# Patient Record
Sex: Female | Born: 1995 | Race: Black or African American | Hispanic: No | Marital: Single
Health system: Southern US, Community
[De-identification: ages and names within clinical notes are randomized; demographics above are authoritative.]

## PROBLEM LIST (undated history)

## (undated) DIAGNOSIS — R202 Paresthesia of skin: Secondary | ICD-10-CM

## (undated) DIAGNOSIS — R200 Anesthesia of skin: Secondary | ICD-10-CM

## (undated) DIAGNOSIS — R531 Weakness: Secondary | ICD-10-CM

## (undated) DIAGNOSIS — R52 Pain, unspecified: Secondary | ICD-10-CM

## (undated) HISTORY — DX: Weakness: R53.1

## (undated) HISTORY — DX: Anesthesia of skin: R20.0

## (undated) HISTORY — DX: Pain, unspecified: R52

## (undated) HISTORY — DX: Anesthesia of skin: R20.2

---

## 2018-10-28 ENCOUNTER — Other Ambulatory Visit: Payer: Self-pay | Admitting: Physician Assistant

## 2018-10-28 DIAGNOSIS — N644 Mastodynia: Secondary | ICD-10-CM

## 2019-07-29 ENCOUNTER — Other Ambulatory Visit: Payer: Self-pay | Admitting: Family Medicine

## 2019-07-29 DIAGNOSIS — R531 Weakness: Secondary | ICD-10-CM

## 2019-07-29 DIAGNOSIS — R200 Anesthesia of skin: Secondary | ICD-10-CM

## 2019-07-29 DIAGNOSIS — R519 Headache, unspecified: Secondary | ICD-10-CM

## 2019-07-30 ENCOUNTER — Other Ambulatory Visit: Payer: Self-pay

## 2019-07-30 ENCOUNTER — Encounter (HOSPITAL_COMMUNITY): Payer: Self-pay

## 2019-07-30 ENCOUNTER — Emergency Department (HOSPITAL_COMMUNITY)
Admission: EM | Admit: 2019-07-30 | Discharge: 2019-07-30 | Disposition: A | Discharge status: Home / Self Care | Payer: Federal, State, Local not specified - PPO | Attending: Emergency Medicine | Admitting: Emergency Medicine

## 2019-07-30 DIAGNOSIS — R42 Dizziness and giddiness: Secondary | ICD-10-CM | POA: Diagnosis not present

## 2019-07-30 DIAGNOSIS — Y999 Unspecified external cause status: Secondary | ICD-10-CM | POA: Diagnosis not present

## 2019-07-30 DIAGNOSIS — Y92410 Unspecified street and highway as the place of occurrence of the external cause: Secondary | ICD-10-CM | POA: Insufficient documentation

## 2019-07-30 DIAGNOSIS — M542 Cervicalgia: Secondary | ICD-10-CM | POA: Insufficient documentation

## 2019-07-30 DIAGNOSIS — Y93I9 Activity, other involving external motion: Secondary | ICD-10-CM | POA: Diagnosis not present

## 2019-07-30 DIAGNOSIS — R519 Headache, unspecified: Secondary | ICD-10-CM | POA: Insufficient documentation

## 2019-07-30 MED ORDER — NAPROXEN 500 MG PO TABS: 500.0000 mg | ORAL_TABLET | Freq: Two times a day (BID) | ORAL | 0 refills | Status: AC | PRN

## 2019-07-30 MED ORDER — CYCLOBENZAPRINE HCL 10 MG PO TABS: 10.0000 mg | ORAL_TABLET | Freq: Every day | ORAL | 0 refills | Status: AC
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
## 2019-07-31 ENCOUNTER — Ambulatory Visit
Admission: RE | Admit: 2019-07-31 | Discharge: 2019-07-31 | Disposition: A | Discharge status: Home / Self Care | Payer: Federal, State, Local not specified - PPO | Source: Ambulatory Visit | Attending: Family Medicine | Admitting: Family Medicine

## 2019-07-31 DIAGNOSIS — R531 Weakness: Secondary | ICD-10-CM

## 2019-07-31 DIAGNOSIS — R200 Anesthesia of skin: Secondary | ICD-10-CM

## 2019-07-31 DIAGNOSIS — R519 Headache, unspecified: Secondary | ICD-10-CM

## 2019-07-31 MED ORDER — GADOBENATE DIMEGLUMINE 529 MG/ML IV SOLN
16.0000 mL | Freq: Once | INTRAVENOUS | Status: AC | PRN
  Administered 2019-07-31: 15:00:00 16 mL via INTRAVENOUS

## 2019-08-21 ENCOUNTER — Other Ambulatory Visit: Payer: Self-pay | Admitting: Family Medicine

## 2019-08-21 DIAGNOSIS — N644 Mastodynia: Secondary | ICD-10-CM

## 2019-08-21 DIAGNOSIS — N63 Unspecified lump in unspecified breast: Secondary | ICD-10-CM

## 2019-09-04 ENCOUNTER — Other Ambulatory Visit: Payer: Self-pay | Admitting: Family Medicine

## 2019-09-04 ENCOUNTER — Other Ambulatory Visit: Payer: Self-pay

## 2019-09-04 ENCOUNTER — Ambulatory Visit
Admission: RE | Admit: 2019-09-04 | Discharge: 2019-09-04 | Disposition: A | Discharge status: Home / Self Care | Payer: Federal, State, Local not specified - PPO | Source: Ambulatory Visit | Attending: Family Medicine | Admitting: Family Medicine

## 2019-09-04 DIAGNOSIS — N644 Mastodynia: Secondary | ICD-10-CM

## 2019-09-04 DIAGNOSIS — N63 Unspecified lump in unspecified breast: Secondary | ICD-10-CM

## 2019-09-29 ENCOUNTER — Ambulatory Visit: Payer: Federal, State, Local not specified - PPO | Admitting: Neurology

## 2019-09-29 ENCOUNTER — Telehealth: Payer: Self-pay | Admitting: *Deleted

## 2019-09-29 NOTE — Telephone Encounter (Signed)
MD out of office unexpectedly. Pt appt today will need to be rescheduled. I tried to call the patient (same # on paper referral and here in Epic) but kept receiving message stating this # is not a working #. Advised front staff in case patient shows up for appointment.

## 2019-09-30 NOTE — Telephone Encounter (Signed)
Continuing to get a message saying the pt's # is not a working # when I try to call to reschedule the appt. I spoke with new patient referrals. They are going to contact referring provider to see if there is another number we can reach the patient at.

## 2019-10-22 ENCOUNTER — Ambulatory Visit: Payer: Federal, State, Local not specified - PPO | Admitting: Neurology

## 2019-11-03 ENCOUNTER — Encounter: Payer: Self-pay | Admitting: *Deleted

## 2019-11-04 ENCOUNTER — Encounter: Payer: Self-pay | Admitting: Diagnostic Neuroimaging

## 2019-11-04 ENCOUNTER — Ambulatory Visit: Payer: Federal, State, Local not specified - PPO | Admitting: Diagnostic Neuroimaging

## 2019-11-04 ENCOUNTER — Telehealth: Payer: Self-pay | Admitting: *Deleted

## 2019-11-04 NOTE — Telephone Encounter (Signed)
Patient was no show for new patient appointment today. 

## 2020-03-08 ENCOUNTER — Other Ambulatory Visit: Payer: Federal, State, Local not specified - PPO

## 2021-07-03 IMAGING — US US BREAST*R* LIMITED INC AXILLA
1 series · 10 of 10 positions shown · non-contrast
Comparison: None.

CLINICAL DATA: Intermittent tenderness and intermittent bruising in
the upper inner right breast for the past year. In the ultrasound
room today, the patient was observed vigorously compressing that
area repeatedly. When asked how often she presses the area, she said
a lot, whenever it hurts. The patient has intermittently felt a mass
in the right axilla for the past year. She reports that her mother
was diagnosed with breast cancer at approximately age 32 and
maternal grandmother diagnosed in her 50s.

EXAM:
ULTRASOUND OF THE RIGHT BREAST

[Series 1: us breast*right* limited inc axilla · 0.06mm/px · 10 of 10 slices shown]
[im 1/10]
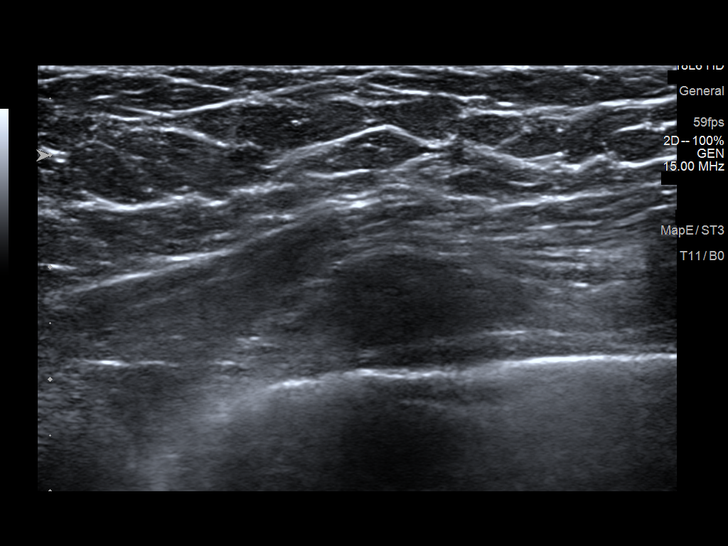
[im 2/10]
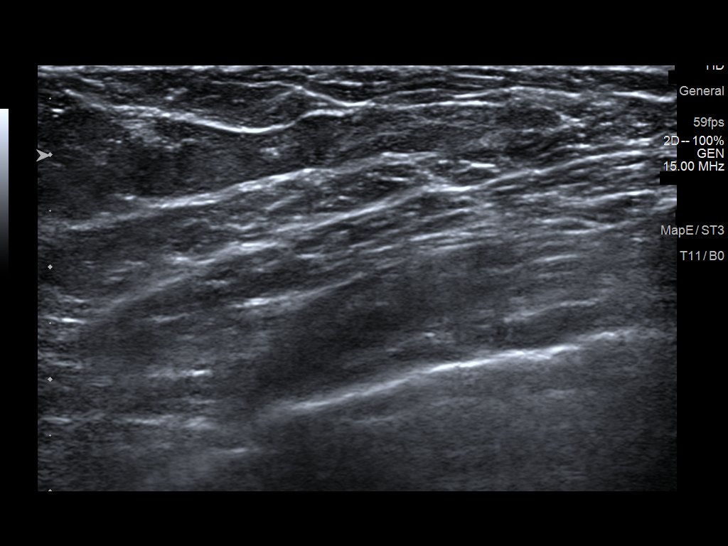
[im 3/10]
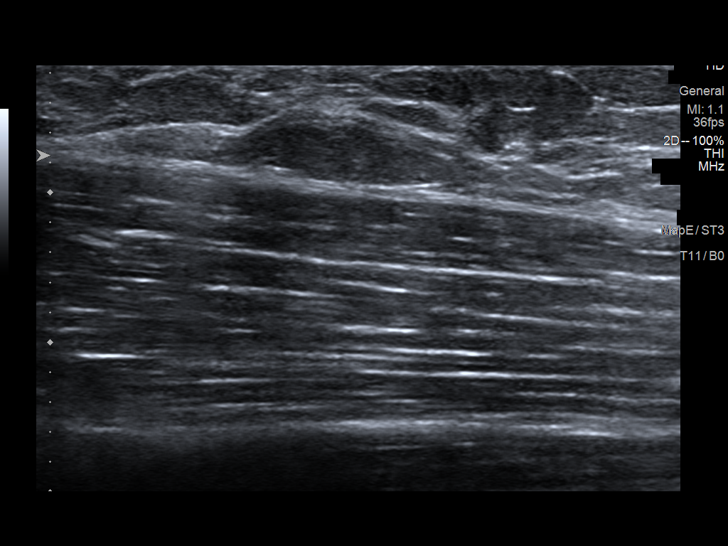
[im 4/10]
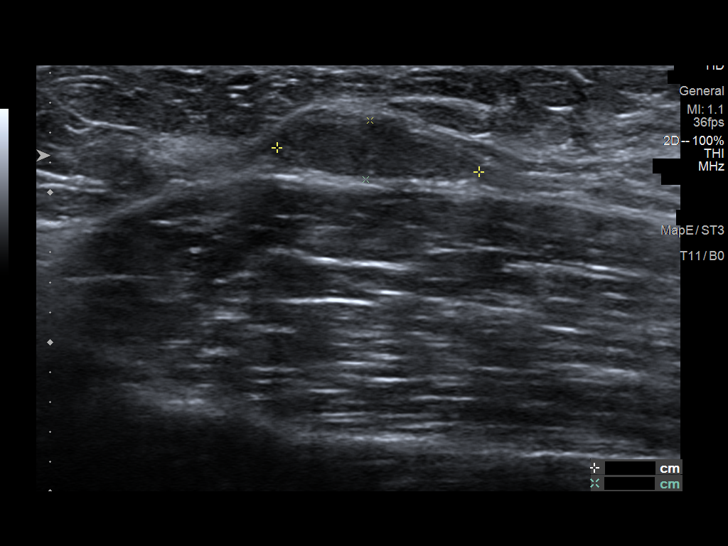
[im 5/10]
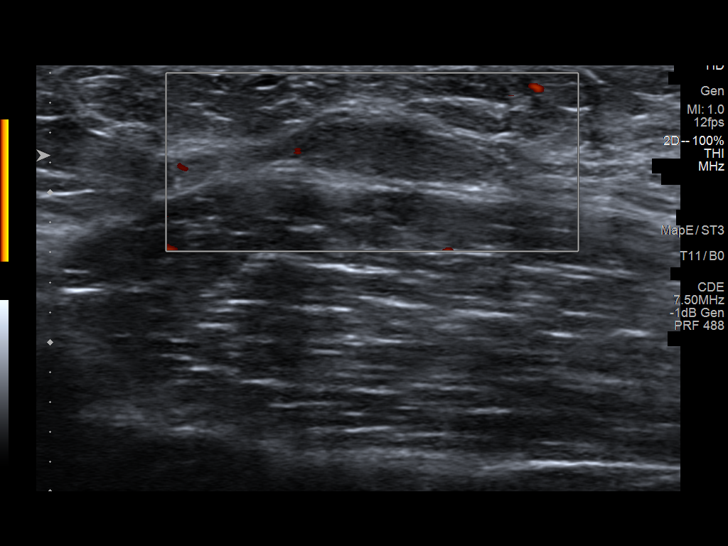
[im 6/10]
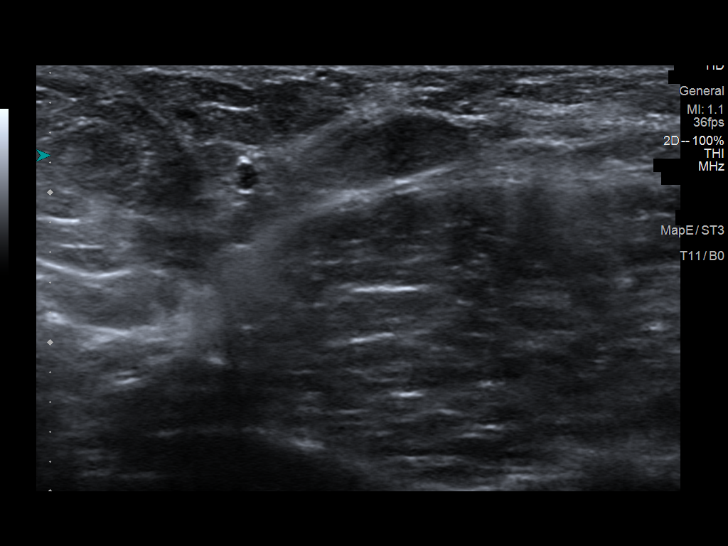
[im 7/10]
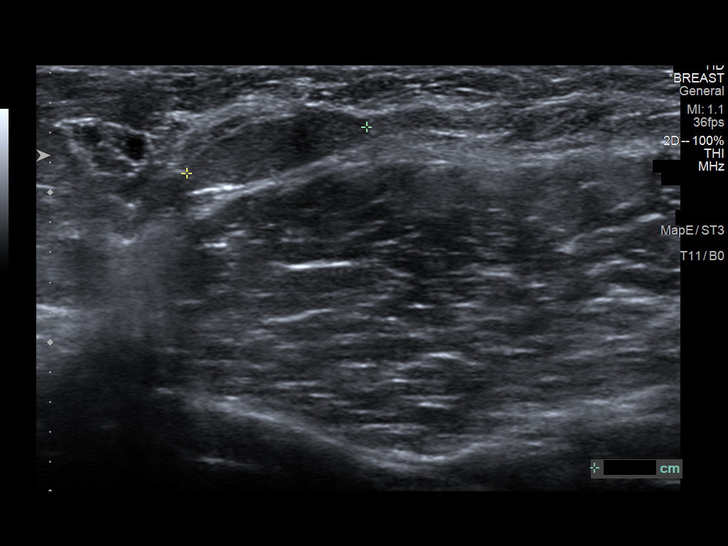
[im 8/10]
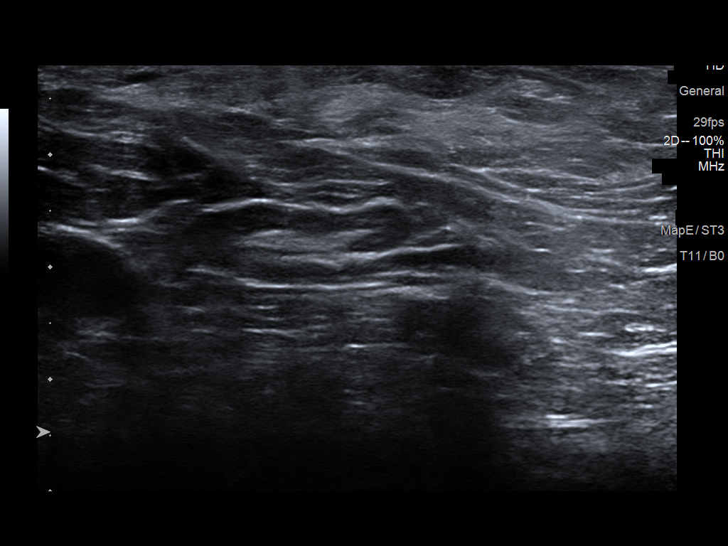
[im 9/10]
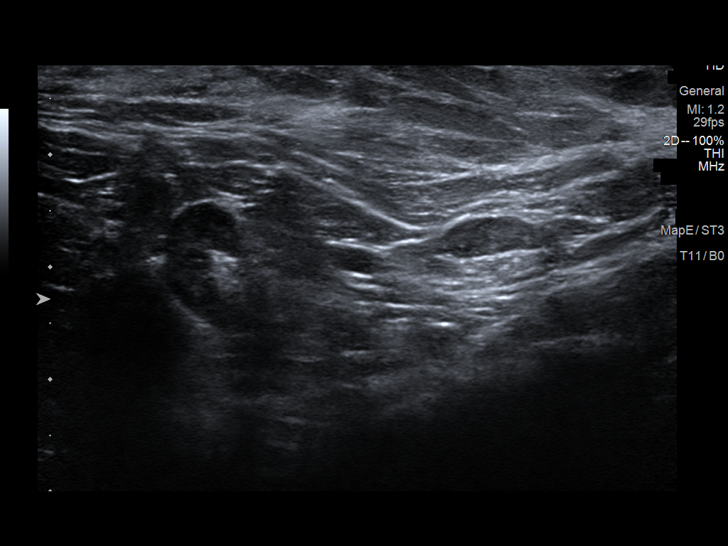
[im 10/10]
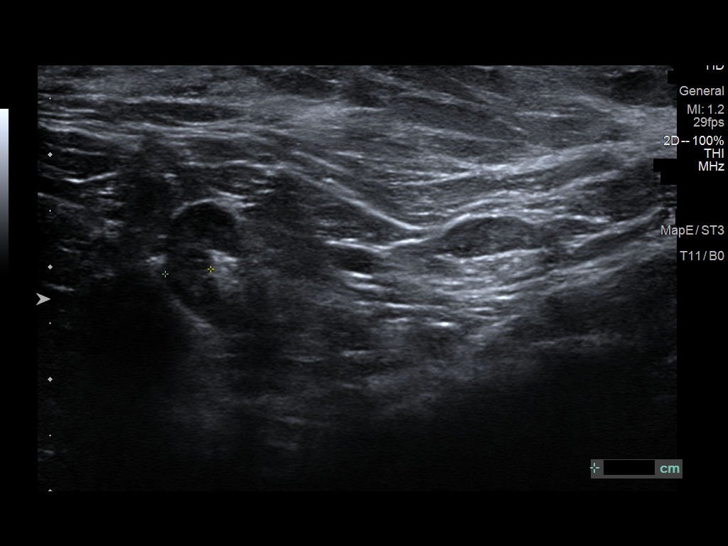

[10 of 10 positions shown; findings below may reference images not displayed]

FINDINGS: On physical exam, the patient is tender to palpation in the upper
inner right breast in the 2 o'clock position, 15 cm from the nipple,
in the parasternal region. There is no palpable mass at that
location.

No mass is palpable in the inferior, medial right axilla at the
location of the mass intermittently felt by the patient. She cannot
feel the mass at that location today.

Targeted ultrasound is performed, showing normal appearing
subcutaneous fat and underlying muscle and ribs in the 2 o'clock
position of the right breast, 15 cm from the nipple, at the location
of focal tenderness.

Ultrasound of the inferior, medial right axilla at the location of
the mass intermittently felt by the patient, demonstrates a 1.4 x
1.2 x 0.4 cm oval, horizontally oriented, circumscribed, medium
echotexture mass anterior the pectoralis muscle. No internal blood
flow was seen with power Doppler.

There are multiple right axillary lymph nodes with mild uniform
cortical thickening. No lymph nodes with eccentric or focal cortical
thickening were seen.
IMPRESSION: 1. 1.4 cm mass in the inferior medial aspect of the right axilla in
the axillary tail region of the right breast. This most likely
represents a lipoma or fibroadenoma.
2. Multiple benign, mildly reactive right axillary lymph nodes.
3. No abnormality in the upper inner right breast at the location of
focal tenderness and intermittent bruising. The possibility that the
intermittent bruising is being caused by the patient's repeated
vigorous palpation of that area was discussed with the patient.

RECOMMENDATION:
Right breast ultrasound in 6 months to re-evaluate the probable
lipoma or fibroadenoma in the axillary tail region. The option of
ultrasound-guided core needle biopsy was also discussed with the
patient but not recommended at this time. She is currently
comfortable with the 6 month followup ultrasound.

I have discussed the findings and recommendations with the patient.
If applicable, a reminder letter will be sent to the patient
regarding the next appointment.

BI-RADS CATEGORY  3: Probably benign.
